# Patient Record
Sex: Female | Born: 1965 | Race: White | Hispanic: No | Marital: Single
Health system: Southern US, Community
[De-identification: ages and names within clinical notes are randomized; demographics above are authoritative.]

## PROBLEM LIST (undated history)

## (undated) DIAGNOSIS — M329 Systemic lupus erythematosus, unspecified: Secondary | ICD-10-CM

---

## 2018-09-09 ENCOUNTER — Emergency Department (HOSPITAL_COMMUNITY): Payer: Self-pay

## 2018-09-09 ENCOUNTER — Encounter (HOSPITAL_COMMUNITY): Payer: Self-pay | Admitting: Emergency Medicine

## 2018-09-09 ENCOUNTER — Emergency Department (HOSPITAL_COMMUNITY)
Admission: EM | Admit: 2018-09-09 | Discharge: 2018-09-09 | Disposition: A | Discharge status: Home / Self Care | Payer: Self-pay | Attending: Emergency Medicine | Admitting: Emergency Medicine

## 2018-09-09 ENCOUNTER — Other Ambulatory Visit: Payer: Self-pay

## 2018-09-09 DIAGNOSIS — R0789 Other chest pain: Secondary | ICD-10-CM | POA: Insufficient documentation

## 2018-09-09 DIAGNOSIS — R079 Chest pain, unspecified: Secondary | ICD-10-CM

## 2018-09-09 HISTORY — DX: Systemic lupus erythematosus, unspecified: M32.9

## 2018-09-09 LAB — CBC WITH DIFFERENTIAL/PLATELET
Abs Immature Granulocytes: 0.02 10*3/uL (ref 0.00–0.07)
Basophils Absolute: 0 10*3/uL (ref 0.0–0.1)
Basophils Relative: 1 %
Eosinophils Absolute: 0.3 10*3/uL (ref 0.0–0.5)
Eosinophils Relative: 7 %
HCT: 37.7 % (ref 36.0–46.0)
Hemoglobin: 11.7 g/dL — ABNORMAL LOW (ref 12.0–15.0)
Immature Granulocytes: 0 %
Lymphocytes Relative: 41 %
Lymphs Abs: 2.1 10*3/uL (ref 0.7–4.0)
MCH: 30.3 pg (ref 26.0–34.0)
MCHC: 31 g/dL (ref 30.0–36.0)
MCV: 97.7 fL (ref 80.0–100.0)
Monocytes Absolute: 0.5 10*3/uL (ref 0.1–1.0)
Monocytes Relative: 10 %
Neutro Abs: 2 10*3/uL (ref 1.7–7.7)
Neutrophils Relative %: 41 %
Platelets: 221 10*3/uL (ref 150–400)
RBC: 3.86 MIL/uL — ABNORMAL LOW (ref 3.87–5.11)
RDW: 13 % (ref 11.5–15.5)
WBC: 5 10*3/uL (ref 4.0–10.5)
nRBC: 0 % (ref 0.0–0.2)

## 2018-09-09 LAB — BASIC METABOLIC PANEL
Anion gap: 6 (ref 5–15)
BUN: 12 mg/dL (ref 6–20)
CO2: 26 mmol/L (ref 22–32)
Calcium: 8.9 mg/dL (ref 8.9–10.3)
Chloride: 107 mmol/L (ref 98–111)
Creatinine, Ser: 0.85 mg/dL (ref 0.44–1.00)
GFR calc Af Amer: >60 mL/min (ref >60)
GFR calc non Af Amer: >60 mL/min (ref >60)
Glucose, Bld: 95 mg/dL (ref 70–99)
Potassium: 4.1 mmol/L (ref 3.5–5.1)
Sodium: 139 mmol/L (ref 135–145)

## 2018-09-09 LAB — I-STAT TROPONIN, ED: Troponin i, poc: 0.01 ng/mL (ref 0.00–0.08)

## 2018-09-09 LAB — D-DIMER, QUANTITATIVE: D-Dimer, Quant: 0.83 ug/mL-FEU — ABNORMAL HIGH (ref 0.00–0.50)

## 2018-09-09 MED ORDER — SODIUM CHLORIDE 0.9 % IV SOLN
INTRAVENOUS | Status: DC
  Administered 2018-09-09: 16:00:00 via INTRAVENOUS

## 2018-09-09 MED ORDER — IOHEXOL 350 MG/ML SOLN
100.0000 mL | Freq: Once | INTRAVENOUS | Status: AC | PRN
  Administered 2018-09-09: 100 mL via INTRAVENOUS

## 2018-09-09 MED ORDER — LORAZEPAM 2 MG/ML IJ SOLN
1.0000 mg | Freq: Once | INTRAMUSCULAR | Status: AC
  Administered 2018-09-09: 1 mg via INTRAVENOUS
  Filled 2018-09-09: qty 1

## 2018-09-09 MED ORDER — MORPHINE SULFATE (PF) 4 MG/ML IV SOLN
4.0000 mg | Freq: Once | INTRAVENOUS | Status: AC
  Administered 2018-09-09: 4 mg via INTRAVENOUS
  Filled 2018-09-09: qty 1

## 2018-09-09 MED ORDER — SODIUM CHLORIDE (PF) 0.9 % IJ SOLN
INTRAMUSCULAR | Status: AC
  Filled 2018-09-09: qty 50

## 2018-09-09 MED ORDER — IOHEXOL 300 MG/ML  SOLN: 100.0000 mL | Freq: Once | INTRAMUSCULAR | Status: DC | PRN

## 2018-09-09 NOTE — ED Notes (Signed)
Signature pad not working in room.  Pt consents for verbal signature.

## 2018-09-09 NOTE — ED Provider Notes (Signed)
[facility]-EMERGENCY DEPT Provider Note   CSN: [id number] Arrival date & time: 09/09/18  1502    History   Chief Complaint Chief Complaint  Patient presents with  . Chest Pain    HPI Sally Jimenez is a 53 y.o. female.     53 year old female presents with constant left-sided pinpoint chest pain is reproducible x48 hours.  No fever or cough.  No anginal component to it.  Is worse with chest wall movement.  No anginal qualities.  No CHF qualities either.  Denies any severe dyspnea.  No leg pain.  Nothing seems to make her symptoms better.  No treatment use prior to arrival.  No prior history of same.     Past Medical History:  Diagnosis Date  . Lupus (HCC)     There are no active problems to display for this patient.   History reviewed. No pertinent surgical history.   OB History   No obstetric history on file.      Home Medications    Prior to Admission medications   Not on File    Family History No family history on file.  Social History Social History   Tobacco Use  . Smoking status: Not on file  Substance Use Topics  . Alcohol use: Not on file  . Drug use: Not on file     Allergies   Patient has no known allergies.   Review of Systems Review of Systems  All other systems reviewed and are negative.    Physical Exam Updated Vital Signs BP 133/83   Pulse 76   Temp 97.8 F (36.6 C) (Oral)   Resp 19   SpO2 97%   Physical Exam Vitals signs and nursing note reviewed.  Constitutional:      General: She is not in acute distress.    Appearance: Normal appearance. She is well-developed. She is not toxic-appearing.  HENT:     Head: Normocephalic and atraumatic.  Eyes:     General: Lids are normal.     Conjunctiva/sclera: Conjunctivae normal.     Pupils: Pupils are equal, round, and reactive to light.  Neck:     Musculoskeletal: Normal range of motion and neck supple.     Thyroid: No thyroid mass.     Trachea:  No tracheal deviation.  Cardiovascular:     Rate and Rhythm: Normal rate and regular rhythm.     Heart sounds: Normal heart sounds. No murmur. No gallop.   Pulmonary:     Effort: Pulmonary effort is normal. No respiratory distress.     Breath sounds: Normal breath sounds. No stridor. No decreased breath sounds, wheezing, rhonchi or rales.  Chest:    Abdominal:     General: Bowel sounds are normal. There is no distension.     Palpations: Abdomen is soft.     Tenderness: There is no abdominal tenderness. There is no rebound.  Musculoskeletal: Normal range of motion.        General: No tenderness.  Skin:    General: Skin is warm and dry.     Findings: No abrasion or rash.  Neurological:     Mental Status: She is alert and oriented to person, place, and time.     GCS: GCS eye subscore is 4. GCS verbal subscore is 5. GCS motor subscore is 6.     Cranial Nerves: No cranial nerve deficit.     Sensory: No sensory deficit.  Psychiatric:  Speech: Speech normal.        Behavior: Behavior normal.      ED Treatments / Results  Labs (all labs ordered are listed, but only abnormal results are displayed) Labs Reviewed  CBC WITH DIFFERENTIAL/PLATELET  BASIC METABOLIC PANEL  D-DIMER, QUANTITATIVE (NOT AT [facility])  I-STAT TROPONIN, ED    EKG EKG Interpretation  Date/Time:  Thursday Sep 09 2018 15:27:57 EDT Ventricular Rate:  72 PR Interval:    QRS Duration: 77 QT Interval:  409 QTC Calculation: 448 R Axis:   66 Text Interpretation:  Sinus rhythm Probable left atrial enlargement Low voltage, precordial leads Anteroseptal infarct, age indeterminate Confirmed by Lorre Nick ([id number]) on 09/09/2018 3:52:01 PM   Radiology No results found.  Procedures Procedures (including critical care time)  Medications Ordered in ED Medications  0.9 %  sodium chloride infusion (has no administration in time range)  LORazepam (ATIVAN) injection 1 mg (has no administration in time range)      Initial Impression / Assessment and Plan / ED Course  I have reviewed the triage vital signs and the nursing notes.  Pertinent labs & imaging results that were available during my care of the patient were reviewed by me and considered in my medical decision making (see chart for details).        Patient with elevation of her d-dimer and subsequently had chest CT which was negative for PE.  Pain is reproducible and pinpoint.  Troponin negative.  EKG without acute ischemic findings.  Medicated here and feels better will discharge home  Final Clinical Impressions(s) / ED Diagnoses   Final diagnoses:  Chest pain    ED Discharge Orders    None       Lorre Nick, MD 09/09/18 [facility]

## 2018-09-09 NOTE — ED Notes (Signed)
Pt ambulatory at D/C.

## 2018-09-09 NOTE — ED Triage Notes (Addendum)
Patient c/o constant sharp central chest pain radiating to the back x2 days with SOB. Denies cough, fever. Hx lupus. Visiting from [place]. 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

## 2020-06-26 IMAGING — CT CT ANGIOGRAPHY CHEST
2 of 6 series · 19 of 46 positions shown · IV contrast (ISOVUE)
Comparison: None.

CLINICAL DATA: Sharp mid chest pain for 2 days

EXAM:
CT ANGIOGRAPHY CHEST WITH CONTRAST
TECHNIQUE: Multidetector CT imaging of the chest was performed using the
standard protocol during bolus administration of intravenous
contrast. Multiplanar CT image reconstructions and MIPs were
obtained to evaluate the vascular anatomy.
CONTRAST:  100 mL OMNIPAQUE IOHEXOL 350 MG/ML SOLN

[Series 5: thins · axial · 0.66mm/px · z∈[+125,+374]mm · 16 of 273 slices shown]
[im 12/273  lung]
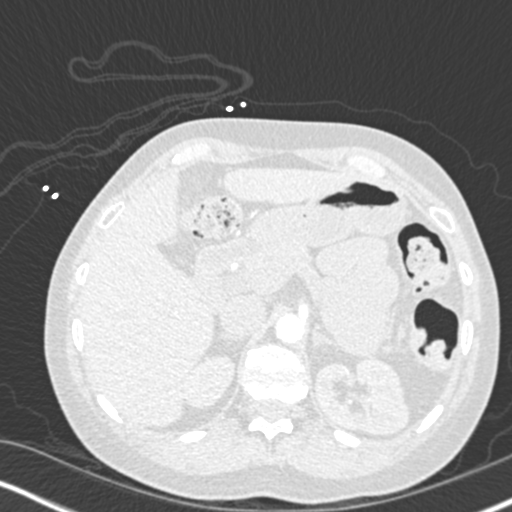
[im 36/273  soft-tissue]
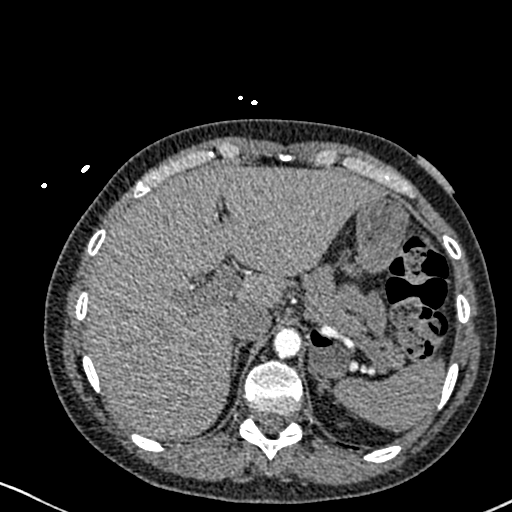
[im 48/273  lung]
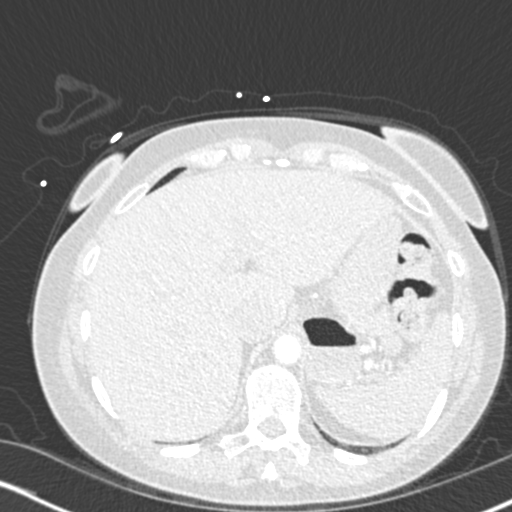
[im 60/273  soft-tissue]
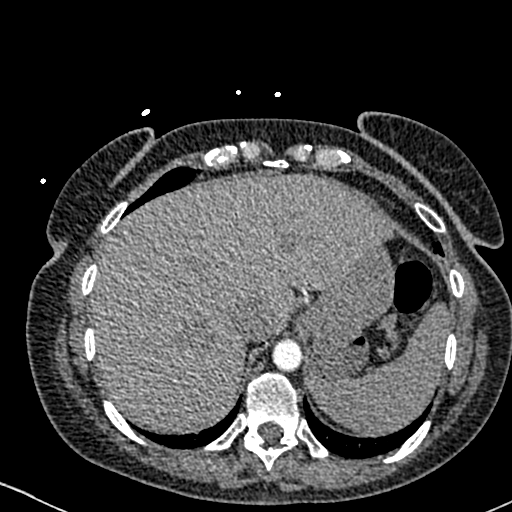
[im 83/273  lung]
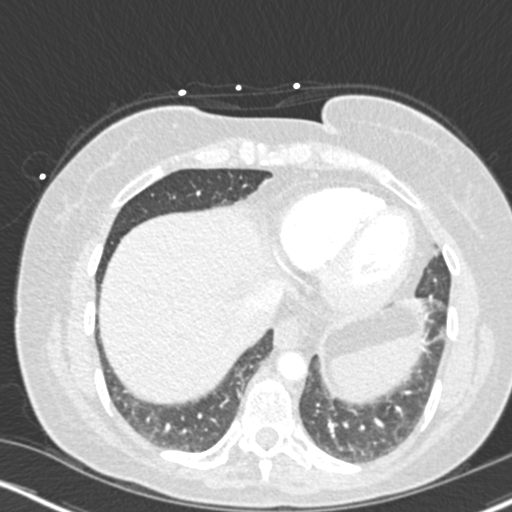
[im 95/273  soft-tissue]
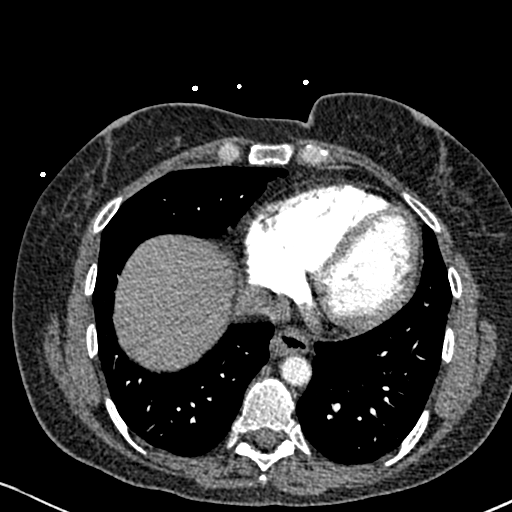
[im 107/273  lung]
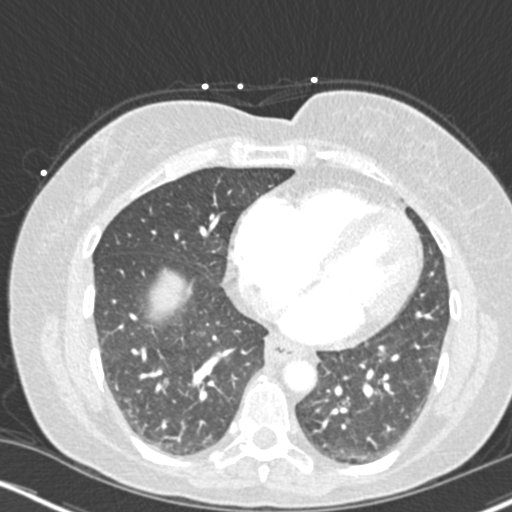
[im 131/273  soft-tissue]
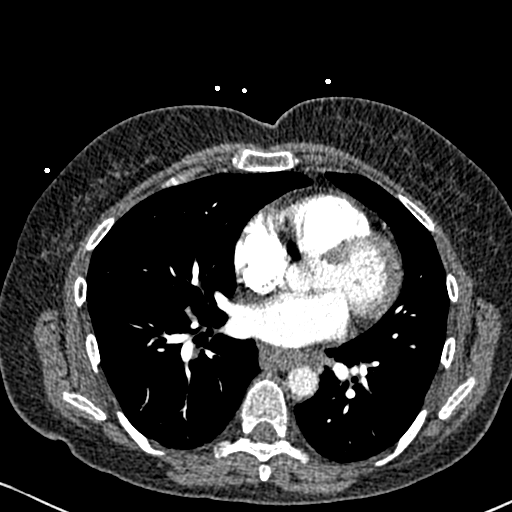
[im 142/273  lung]
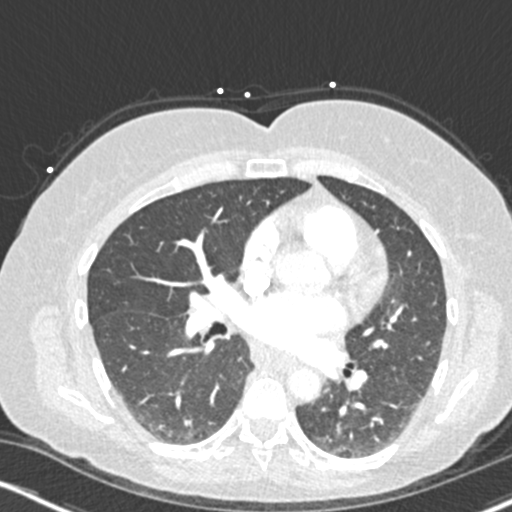
[im 166/273  soft-tissue]
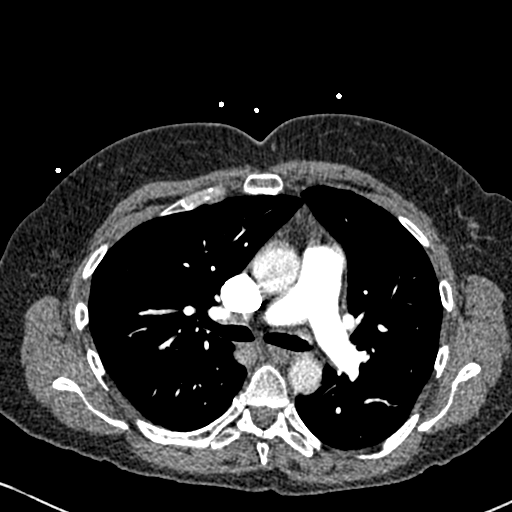
[im 178/273  lung]
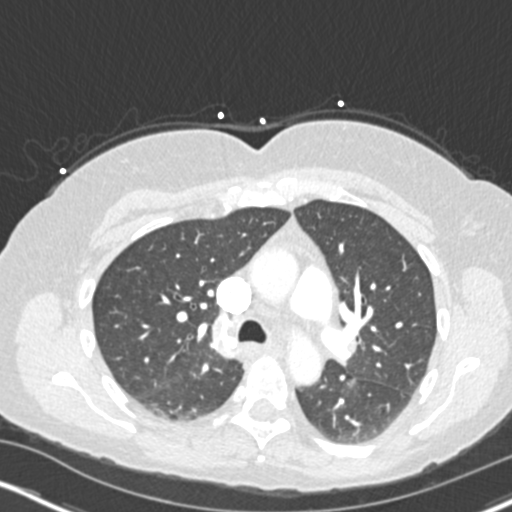
[im 190/273  soft-tissue]
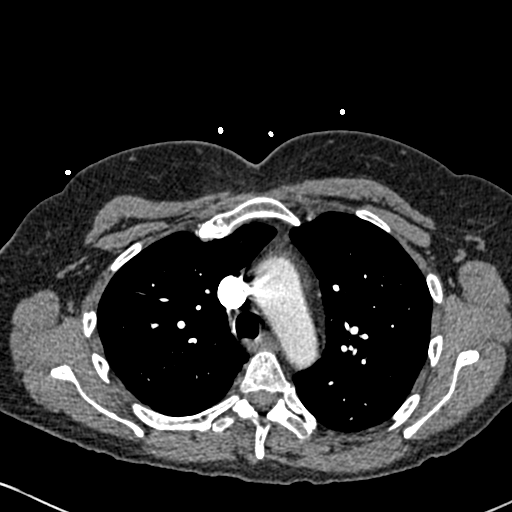
[im 213/273  lung]
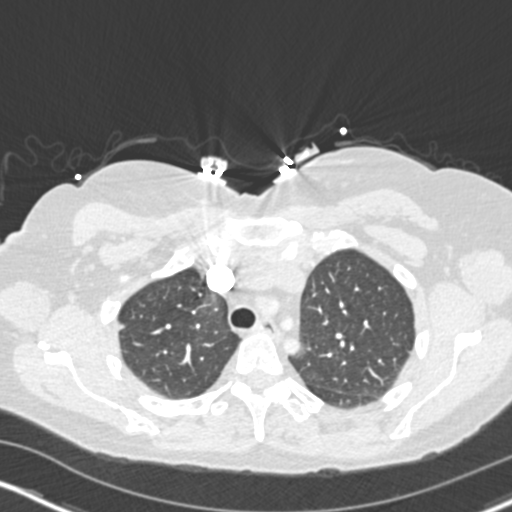
[im 225/273  soft-tissue]
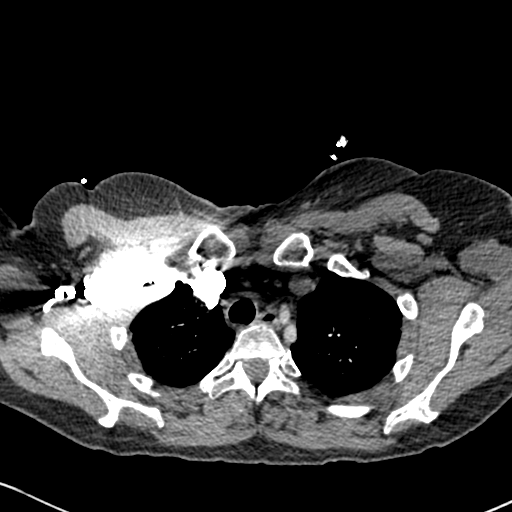
[im 237/273  lung]
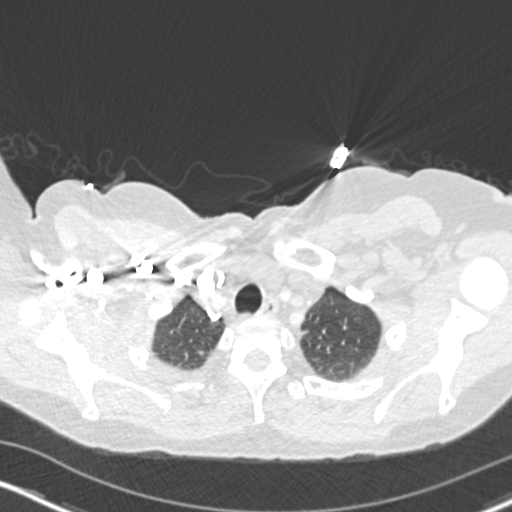
[im 261/273  soft-tissue]
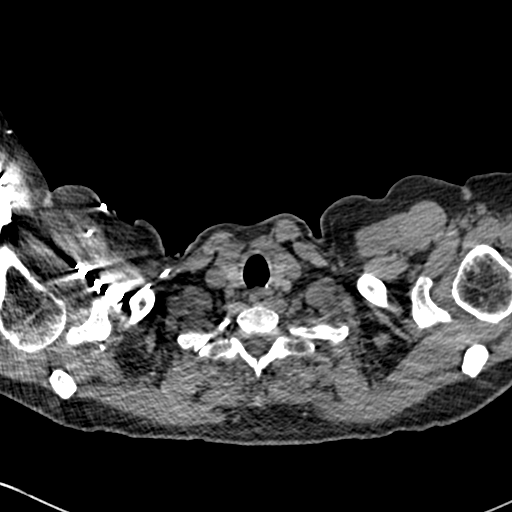

[Series 7: coronal mpr · coronal · 0.56mm/px · 3 of 130 slices shown]
[im 33/130  soft-tissue]
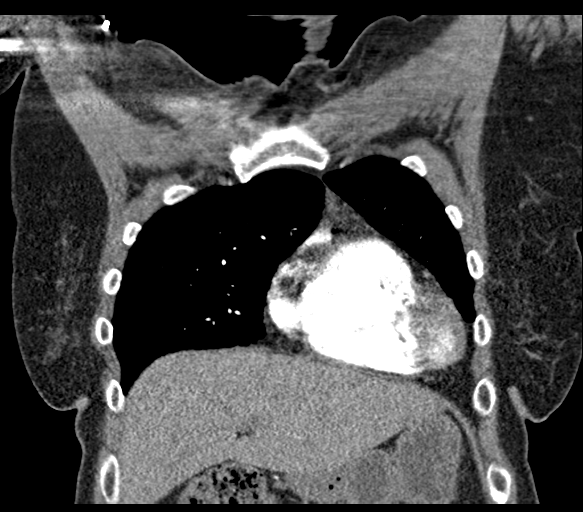
[im 65/130  soft-tissue]
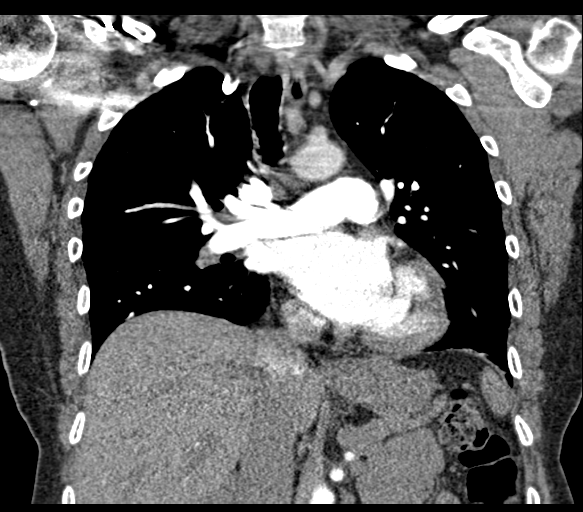
[im 97/130  soft-tissue]
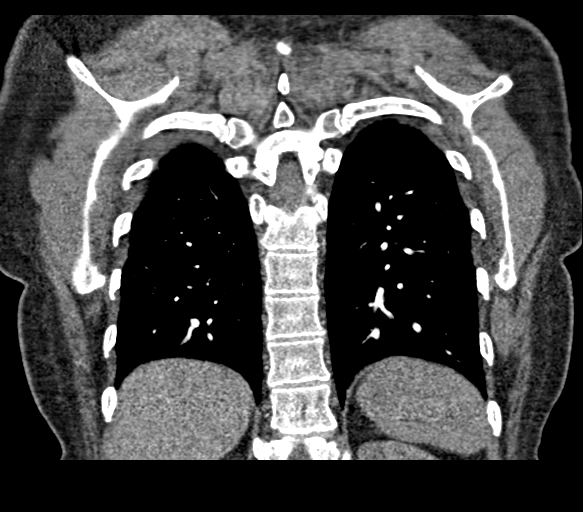

[19 of 46 positions shown; findings below may reference images not displayed]

FINDINGS: Cardiovascular: Satisfactory opacification of the pulmonary arteries
to the segmental level. No evidence of pulmonary embolism. Normal
heart size. No pericardial effusion.

Mediastinum/Nodes: No enlarged mediastinal, hilar, or axillary lymph
nodes. Thyroid gland, trachea, and esophagus demonstrate no
significant findings.

Lungs/Pleura: Lungs are clear. No pleural effusion or pneumothorax.

Upper Abdomen: No acute abnormality.

Musculoskeletal: No chest wall abnormality. No acute or significant
osseous findings.

Review of the MIP images confirms the above findings.
IMPRESSION: 1. No evidence of pulmonary embolus.

## 2020-06-26 IMAGING — CR CHEST - 2 VIEW
2 series · 2 of 2 positions shown · non-contrast
Comparison: None.

CLINICAL DATA: Constant sharp central chest pain radiating to the
back for 2 days

EXAM:
CHEST - 2 VIEW

[w chest pa]
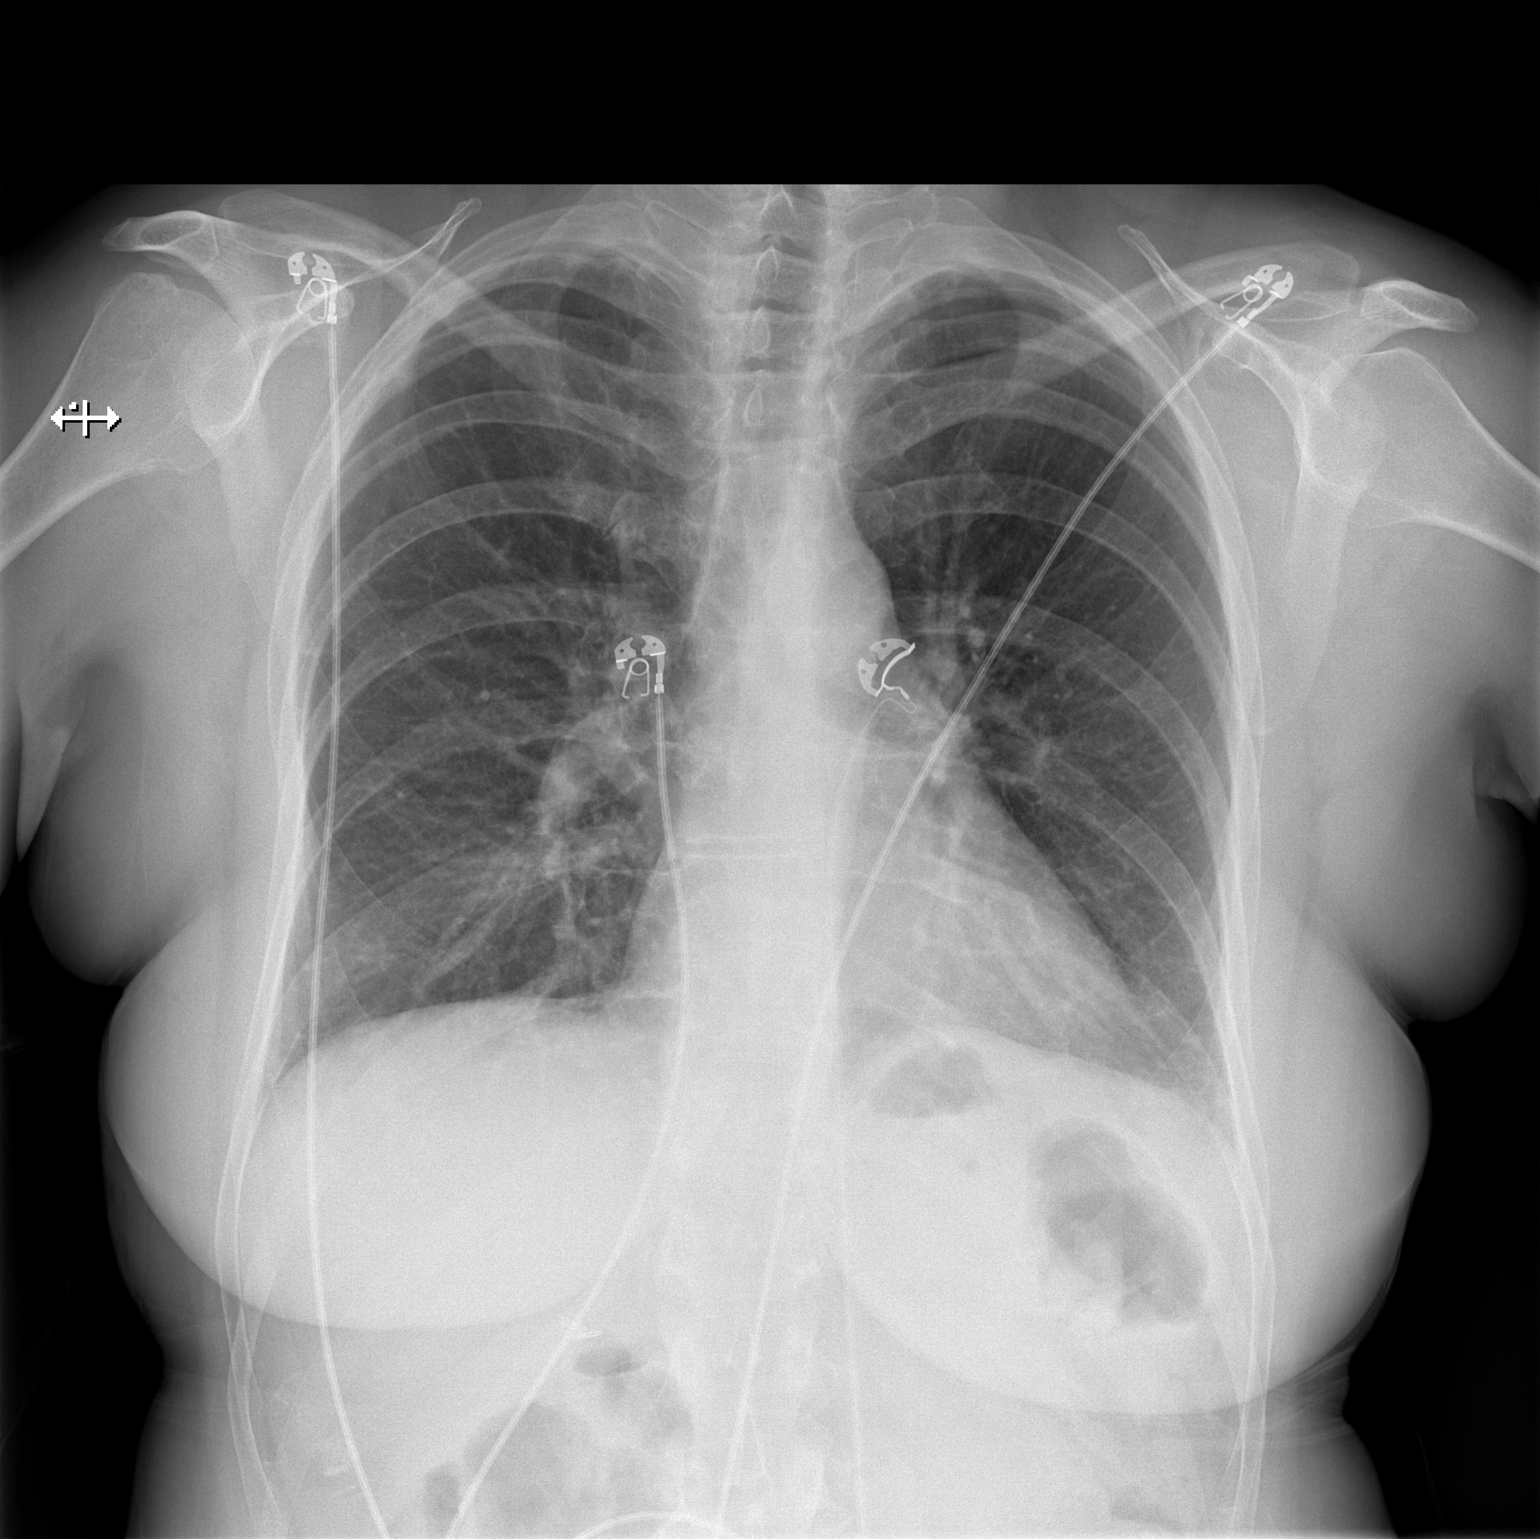

[w chest lat]
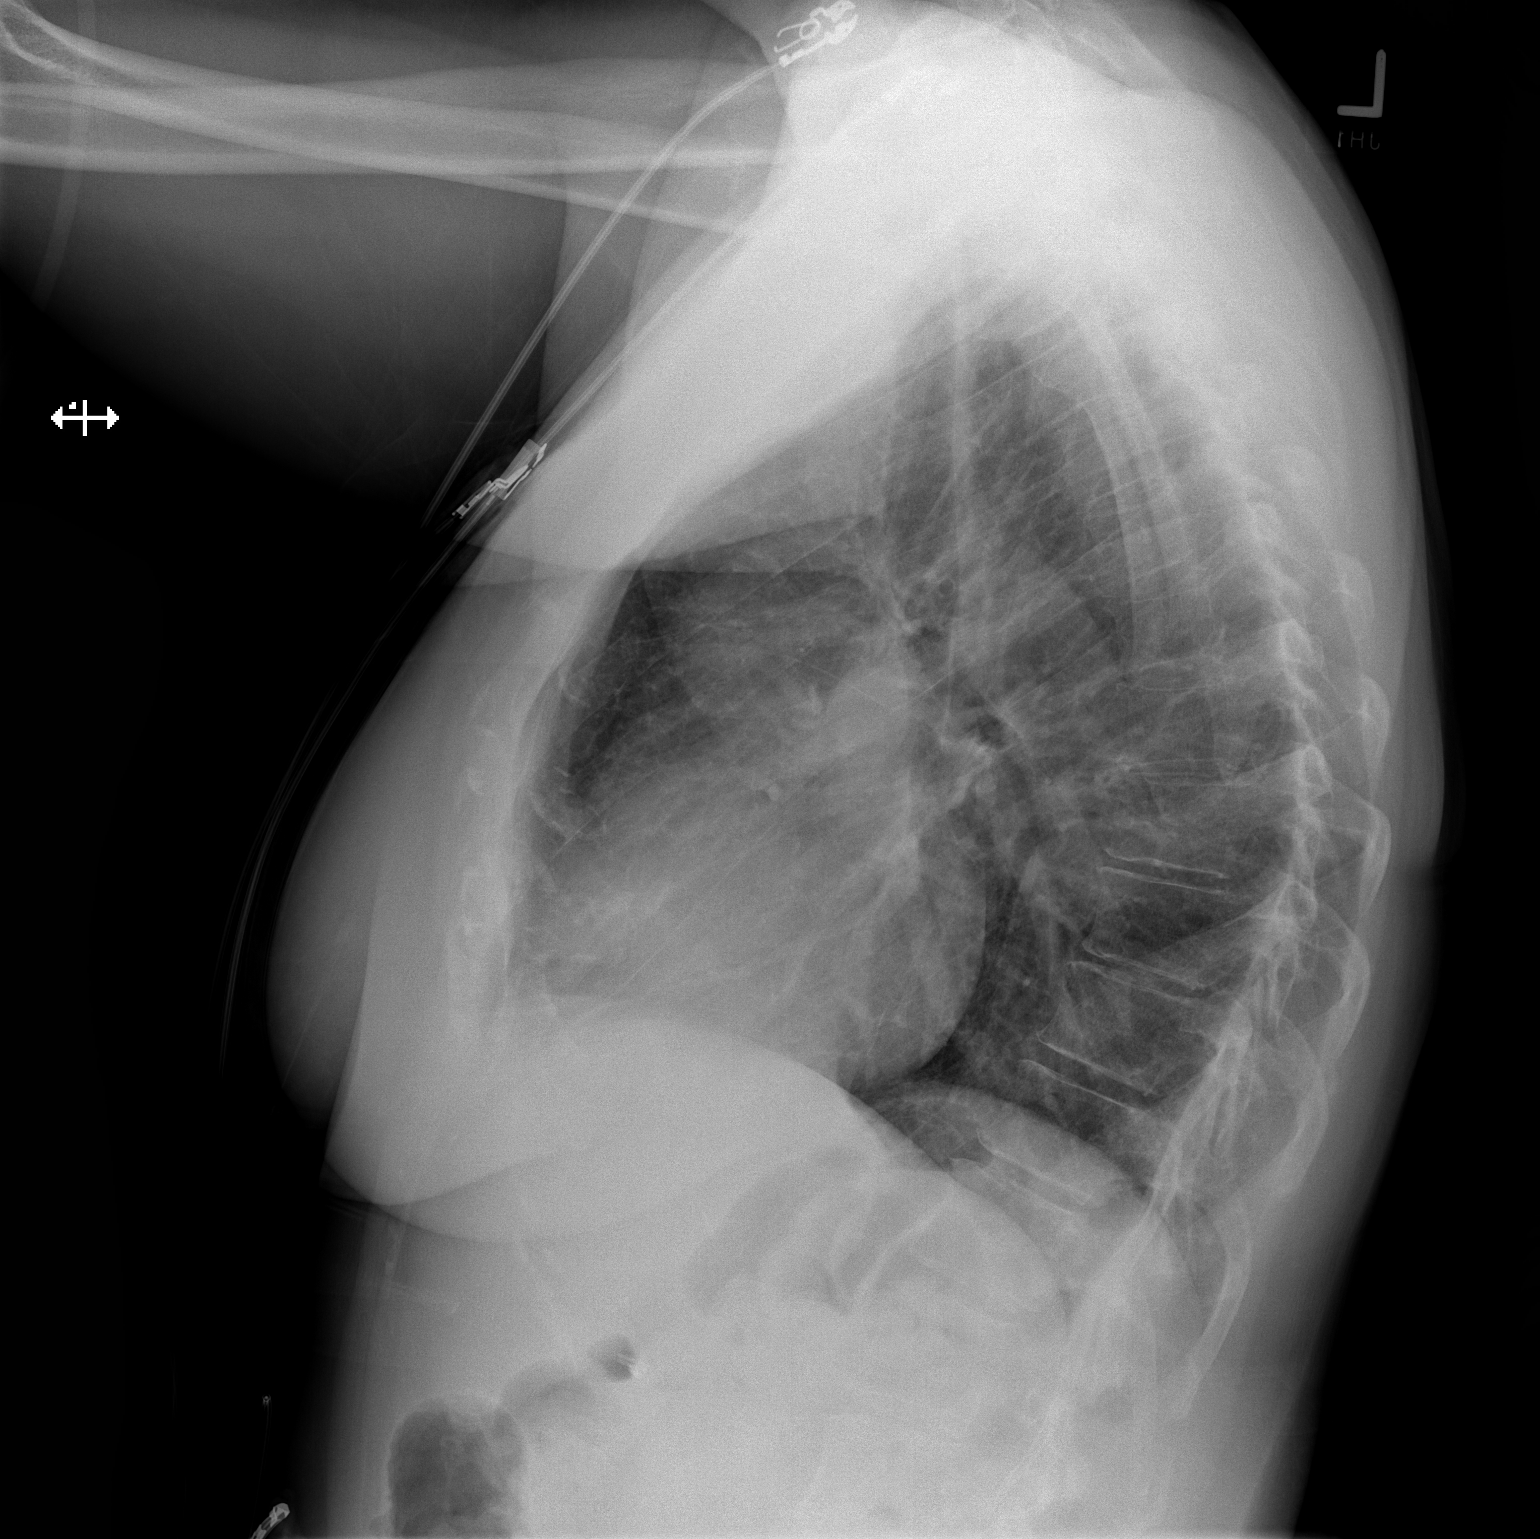

[2 of 2 positions shown; findings below may reference images not displayed]

FINDINGS: The heart size and mediastinal contours are within normal limits.
Both lungs are clear. The visualized skeletal structures are
unremarkable.
IMPRESSION: No active cardiopulmonary disease.
